# Patient Record
Sex: Female | Born: 1945 | Race: Black or African American | Hispanic: No | Marital: Married | Smoking: Never smoker
Health system: Southern US, Community
[De-identification: ages and names within clinical notes are randomized; demographics above are authoritative.]

## PROBLEM LIST (undated history)

## (undated) DIAGNOSIS — J189 Pneumonia, unspecified organism: Secondary | ICD-10-CM

---

## 2000-01-21 ENCOUNTER — Ambulatory Visit (HOSPITAL_COMMUNITY): Admission: RE | Admit: 2000-01-21 | Discharge: 2000-01-22 | Payer: Self-pay | Admitting: Ophthalmology

## 2000-01-21 ENCOUNTER — Encounter: Payer: Self-pay | Admitting: Ophthalmology

## 2014-02-20 DIAGNOSIS — I10 Essential (primary) hypertension: Secondary | ICD-10-CM | POA: Insufficient documentation

## 2014-11-23 DIAGNOSIS — Z1211 Encounter for screening for malignant neoplasm of colon: Secondary | ICD-10-CM | POA: Diagnosis not present

## 2014-11-23 DIAGNOSIS — I10 Essential (primary) hypertension: Secondary | ICD-10-CM | POA: Diagnosis not present

## 2014-11-23 DIAGNOSIS — Z1231 Encounter for screening mammogram for malignant neoplasm of breast: Secondary | ICD-10-CM | POA: Diagnosis not present

## 2014-11-23 DIAGNOSIS — R55 Syncope and collapse: Secondary | ICD-10-CM | POA: Diagnosis not present

## 2014-11-23 DIAGNOSIS — Z1239 Encounter for other screening for malignant neoplasm of breast: Secondary | ICD-10-CM | POA: Diagnosis not present

## 2014-12-12 DIAGNOSIS — Z1211 Encounter for screening for malignant neoplasm of colon: Secondary | ICD-10-CM | POA: Diagnosis not present

## 2014-12-13 DIAGNOSIS — R928 Other abnormal and inconclusive findings on diagnostic imaging of breast: Secondary | ICD-10-CM | POA: Diagnosis not present

## 2014-12-27 DIAGNOSIS — J40 Bronchitis, not specified as acute or chronic: Secondary | ICD-10-CM | POA: Diagnosis not present

## 2014-12-27 DIAGNOSIS — R05 Cough: Secondary | ICD-10-CM | POA: Diagnosis not present

## 2015-01-24 DIAGNOSIS — Z1211 Encounter for screening for malignant neoplasm of colon: Secondary | ICD-10-CM | POA: Diagnosis not present

## 2015-01-24 DIAGNOSIS — K5730 Diverticulosis of large intestine without perforation or abscess without bleeding: Secondary | ICD-10-CM | POA: Diagnosis not present

## 2015-01-24 DIAGNOSIS — D123 Benign neoplasm of transverse colon: Secondary | ICD-10-CM | POA: Diagnosis not present

## 2015-01-24 DIAGNOSIS — K635 Polyp of colon: Secondary | ICD-10-CM | POA: Diagnosis not present

## 2015-01-24 DIAGNOSIS — D120 Benign neoplasm of cecum: Secondary | ICD-10-CM | POA: Diagnosis not present

## 2015-01-24 DIAGNOSIS — K648 Other hemorrhoids: Secondary | ICD-10-CM | POA: Diagnosis not present

## 2015-01-30 DIAGNOSIS — I83813 Varicose veins of bilateral lower extremities with pain: Secondary | ICD-10-CM | POA: Diagnosis not present

## 2015-02-05 DIAGNOSIS — R55 Syncope and collapse: Secondary | ICD-10-CM | POA: Diagnosis not present

## 2015-02-08 DIAGNOSIS — R7989 Other specified abnormal findings of blood chemistry: Secondary | ICD-10-CM | POA: Diagnosis not present

## 2015-02-08 DIAGNOSIS — Z0000 Encounter for general adult medical examination without abnormal findings: Secondary | ICD-10-CM | POA: Diagnosis not present

## 2015-02-08 DIAGNOSIS — I10 Essential (primary) hypertension: Secondary | ICD-10-CM | POA: Diagnosis not present

## 2015-02-08 DIAGNOSIS — R6889 Other general symptoms and signs: Secondary | ICD-10-CM | POA: Diagnosis not present

## 2015-02-08 DIAGNOSIS — Z23 Encounter for immunization: Secondary | ICD-10-CM | POA: Diagnosis not present

## 2015-06-12 DIAGNOSIS — Z23 Encounter for immunization: Secondary | ICD-10-CM | POA: Diagnosis not present

## 2015-06-20 DIAGNOSIS — R928 Other abnormal and inconclusive findings on diagnostic imaging of breast: Secondary | ICD-10-CM | POA: Diagnosis not present

## 2015-06-20 DIAGNOSIS — Z09 Encounter for follow-up examination after completed treatment for conditions other than malignant neoplasm: Secondary | ICD-10-CM | POA: Diagnosis not present

## 2015-07-17 DIAGNOSIS — L729 Follicular cyst of the skin and subcutaneous tissue, unspecified: Secondary | ICD-10-CM | POA: Diagnosis not present

## 2015-08-14 DIAGNOSIS — I10 Essential (primary) hypertension: Secondary | ICD-10-CM | POA: Diagnosis not present

## 2015-12-19 DIAGNOSIS — R928 Other abnormal and inconclusive findings on diagnostic imaging of breast: Secondary | ICD-10-CM | POA: Diagnosis not present

## 2015-12-19 DIAGNOSIS — Z09 Encounter for follow-up examination after completed treatment for conditions other than malignant neoplasm: Secondary | ICD-10-CM | POA: Diagnosis not present

## 2015-12-19 DIAGNOSIS — N6489 Other specified disorders of breast: Secondary | ICD-10-CM | POA: Diagnosis not present

## 2016-02-13 DIAGNOSIS — Z1159 Encounter for screening for other viral diseases: Secondary | ICD-10-CM | POA: Diagnosis not present

## 2016-02-13 DIAGNOSIS — I10 Essential (primary) hypertension: Secondary | ICD-10-CM | POA: Diagnosis not present

## 2016-03-17 DIAGNOSIS — M25569 Pain in unspecified knee: Secondary | ICD-10-CM | POA: Diagnosis not present

## 2016-03-24 DIAGNOSIS — M25569 Pain in unspecified knee: Secondary | ICD-10-CM | POA: Diagnosis not present

## 2016-03-24 DIAGNOSIS — M2242 Chondromalacia patellae, left knee: Secondary | ICD-10-CM | POA: Diagnosis not present

## 2016-05-21 DIAGNOSIS — Z23 Encounter for immunization: Secondary | ICD-10-CM | POA: Diagnosis not present

## 2016-08-21 DIAGNOSIS — J069 Acute upper respiratory infection, unspecified: Secondary | ICD-10-CM | POA: Diagnosis not present

## 2016-09-02 DIAGNOSIS — I10 Essential (primary) hypertension: Secondary | ICD-10-CM | POA: Diagnosis not present

## 2016-09-02 DIAGNOSIS — Z13820 Encounter for screening for osteoporosis: Secondary | ICD-10-CM | POA: Diagnosis not present

## 2016-09-02 DIAGNOSIS — Z0000 Encounter for general adult medical examination without abnormal findings: Secondary | ICD-10-CM | POA: Diagnosis not present

## 2016-09-02 DIAGNOSIS — Z86010 Personal history of colonic polyps: Secondary | ICD-10-CM | POA: Diagnosis not present

## 2016-09-18 DIAGNOSIS — M8588 Other specified disorders of bone density and structure, other site: Secondary | ICD-10-CM | POA: Diagnosis not present

## 2016-09-18 DIAGNOSIS — Z780 Asymptomatic menopausal state: Secondary | ICD-10-CM | POA: Diagnosis not present

## 2016-09-18 DIAGNOSIS — Z13820 Encounter for screening for osteoporosis: Secondary | ICD-10-CM | POA: Diagnosis not present

## 2016-09-22 DIAGNOSIS — M8580 Other specified disorders of bone density and structure, unspecified site: Secondary | ICD-10-CM | POA: Insufficient documentation

## 2017-03-02 DIAGNOSIS — I10 Essential (primary) hypertension: Secondary | ICD-10-CM | POA: Diagnosis not present

## 2017-03-31 DIAGNOSIS — M1712 Unilateral primary osteoarthritis, left knee: Secondary | ICD-10-CM | POA: Diagnosis not present

## 2017-06-03 DIAGNOSIS — Z23 Encounter for immunization: Secondary | ICD-10-CM | POA: Diagnosis not present

## 2017-09-08 DIAGNOSIS — Z86010 Personal history of colonic polyps: Secondary | ICD-10-CM | POA: Insufficient documentation

## 2017-09-09 DIAGNOSIS — I10 Essential (primary) hypertension: Secondary | ICD-10-CM | POA: Diagnosis not present

## 2017-09-09 DIAGNOSIS — Z0000 Encounter for general adult medical examination without abnormal findings: Secondary | ICD-10-CM | POA: Diagnosis not present

## 2017-09-09 DIAGNOSIS — Z79899 Other long term (current) drug therapy: Secondary | ICD-10-CM | POA: Diagnosis not present

## 2017-09-25 DIAGNOSIS — E876 Hypokalemia: Secondary | ICD-10-CM | POA: Diagnosis not present

## 2017-12-16 DIAGNOSIS — R922 Inconclusive mammogram: Secondary | ICD-10-CM | POA: Diagnosis not present

## 2017-12-16 DIAGNOSIS — Z1231 Encounter for screening mammogram for malignant neoplasm of breast: Secondary | ICD-10-CM | POA: Diagnosis not present

## 2017-12-16 DIAGNOSIS — N6489 Other specified disorders of breast: Secondary | ICD-10-CM | POA: Diagnosis not present

## 2018-03-09 DIAGNOSIS — E876 Hypokalemia: Secondary | ICD-10-CM | POA: Diagnosis not present

## 2018-03-09 DIAGNOSIS — I10 Essential (primary) hypertension: Secondary | ICD-10-CM | POA: Diagnosis not present

## 2018-03-09 DIAGNOSIS — M8588 Other specified disorders of bone density and structure, other site: Secondary | ICD-10-CM | POA: Diagnosis not present

## 2018-03-09 DIAGNOSIS — Z0000 Encounter for general adult medical examination without abnormal findings: Secondary | ICD-10-CM | POA: Diagnosis not present

## 2018-05-07 DIAGNOSIS — S6992XA Unspecified injury of left wrist, hand and finger(s), initial encounter: Secondary | ICD-10-CM | POA: Insufficient documentation

## 2018-05-07 DIAGNOSIS — S6992XD Unspecified injury of left wrist, hand and finger(s), subsequent encounter: Secondary | ICD-10-CM | POA: Diagnosis not present

## 2018-05-07 DIAGNOSIS — Z23 Encounter for immunization: Secondary | ICD-10-CM | POA: Diagnosis not present

## 2018-05-07 DIAGNOSIS — M79645 Pain in left finger(s): Secondary | ICD-10-CM | POA: Diagnosis not present

## 2019-04-08 DIAGNOSIS — Z124 Encounter for screening for malignant neoplasm of cervix: Secondary | ICD-10-CM | POA: Diagnosis not present

## 2019-04-08 DIAGNOSIS — S6992XS Unspecified injury of left wrist, hand and finger(s), sequela: Secondary | ICD-10-CM | POA: Diagnosis not present

## 2019-04-08 DIAGNOSIS — Z13228 Encounter for screening for other metabolic disorders: Secondary | ICD-10-CM | POA: Diagnosis not present

## 2019-04-08 DIAGNOSIS — Z79899 Other long term (current) drug therapy: Secondary | ICD-10-CM | POA: Diagnosis not present

## 2019-04-08 DIAGNOSIS — Z1389 Encounter for screening for other disorder: Secondary | ICD-10-CM | POA: Diagnosis not present

## 2019-04-08 DIAGNOSIS — I10 Essential (primary) hypertension: Secondary | ICD-10-CM | POA: Diagnosis not present

## 2019-04-08 DIAGNOSIS — Z0000 Encounter for general adult medical examination without abnormal findings: Secondary | ICD-10-CM | POA: Diagnosis not present

## 2019-04-08 DIAGNOSIS — Z13220 Encounter for screening for lipoid disorders: Secondary | ICD-10-CM | POA: Diagnosis not present

## 2019-04-08 DIAGNOSIS — Z1239 Encounter for other screening for malignant neoplasm of breast: Secondary | ICD-10-CM | POA: Diagnosis not present

## 2019-04-08 DIAGNOSIS — Z130 Encounter for screening for diseases of the blood and blood-forming organs and certain disorders involving the immune mechanism: Secondary | ICD-10-CM | POA: Diagnosis not present

## 2019-04-08 DIAGNOSIS — Z1329 Encounter for screening for other suspected endocrine disorder: Secondary | ICD-10-CM | POA: Diagnosis not present

## 2019-04-08 DIAGNOSIS — M8588 Other specified disorders of bone density and structure, other site: Secondary | ICD-10-CM | POA: Diagnosis not present

## 2019-04-08 DIAGNOSIS — Z1321 Encounter for screening for nutritional disorder: Secondary | ICD-10-CM | POA: Diagnosis not present

## 2019-04-26 DIAGNOSIS — Z1231 Encounter for screening mammogram for malignant neoplasm of breast: Secondary | ICD-10-CM | POA: Diagnosis not present

## 2019-04-26 DIAGNOSIS — Z780 Asymptomatic menopausal state: Secondary | ICD-10-CM | POA: Diagnosis not present

## 2019-04-26 DIAGNOSIS — M8588 Other specified disorders of bone density and structure, other site: Secondary | ICD-10-CM | POA: Diagnosis not present

## 2019-04-26 DIAGNOSIS — M8589 Other specified disorders of bone density and structure, multiple sites: Secondary | ICD-10-CM | POA: Diagnosis not present

## 2019-04-26 DIAGNOSIS — Z1239 Encounter for other screening for malignant neoplasm of breast: Secondary | ICD-10-CM | POA: Diagnosis not present

## 2019-05-17 DIAGNOSIS — S99921A Unspecified injury of right foot, initial encounter: Secondary | ICD-10-CM | POA: Diagnosis not present

## 2019-05-17 DIAGNOSIS — M654 Radial styloid tenosynovitis [de Quervain]: Secondary | ICD-10-CM | POA: Insufficient documentation

## 2019-05-17 DIAGNOSIS — S92514A Nondisplaced fracture of proximal phalanx of right lesser toe(s), initial encounter for closed fracture: Secondary | ICD-10-CM | POA: Diagnosis not present

## 2019-05-17 DIAGNOSIS — M7989 Other specified soft tissue disorders: Secondary | ICD-10-CM | POA: Diagnosis not present

## 2019-05-17 DIAGNOSIS — I10 Essential (primary) hypertension: Secondary | ICD-10-CM | POA: Diagnosis not present

## 2019-05-17 DIAGNOSIS — S92524A Nondisplaced fracture of medial phalanx of right lesser toe(s), initial encounter for closed fracture: Secondary | ICD-10-CM | POA: Diagnosis not present

## 2019-05-17 DIAGNOSIS — M8580 Other specified disorders of bone density and structure, unspecified site: Secondary | ICD-10-CM | POA: Diagnosis not present

## 2019-05-17 DIAGNOSIS — M7731 Calcaneal spur, right foot: Secondary | ICD-10-CM | POA: Diagnosis not present

## 2019-05-17 DIAGNOSIS — M1712 Unilateral primary osteoarthritis, left knee: Secondary | ICD-10-CM | POA: Diagnosis not present

## 2019-05-17 DIAGNOSIS — M19071 Primary osteoarthritis, right ankle and foot: Secondary | ICD-10-CM | POA: Diagnosis not present

## 2019-05-18 DIAGNOSIS — Z23 Encounter for immunization: Secondary | ICD-10-CM | POA: Diagnosis not present

## 2019-05-24 DIAGNOSIS — S92514A Nondisplaced fracture of proximal phalanx of right lesser toe(s), initial encounter for closed fracture: Secondary | ICD-10-CM | POA: Diagnosis not present

## 2019-12-30 DIAGNOSIS — J0100 Acute maxillary sinusitis, unspecified: Secondary | ICD-10-CM | POA: Diagnosis not present

## 2020-03-08 DIAGNOSIS — H539 Unspecified visual disturbance: Secondary | ICD-10-CM | POA: Diagnosis not present

## 2020-03-08 DIAGNOSIS — H2511 Age-related nuclear cataract, right eye: Secondary | ICD-10-CM | POA: Diagnosis not present

## 2020-03-08 DIAGNOSIS — Z961 Presence of intraocular lens: Secondary | ICD-10-CM | POA: Diagnosis not present

## 2020-03-08 DIAGNOSIS — H338 Other retinal detachments: Secondary | ICD-10-CM | POA: Diagnosis not present

## 2020-03-08 DIAGNOSIS — Z135 Encounter for screening for eye and ear disorders: Secondary | ICD-10-CM | POA: Diagnosis not present

## 2020-03-28 DIAGNOSIS — K5792 Diverticulitis of intestine, part unspecified, without perforation or abscess without bleeding: Secondary | ICD-10-CM | POA: Diagnosis not present

## 2020-03-28 DIAGNOSIS — I10 Essential (primary) hypertension: Secondary | ICD-10-CM | POA: Diagnosis not present

## 2020-03-28 DIAGNOSIS — K5732 Diverticulitis of large intestine without perforation or abscess without bleeding: Secondary | ICD-10-CM | POA: Diagnosis not present

## 2020-03-28 DIAGNOSIS — D175 Benign lipomatous neoplasm of intra-abdominal organs: Secondary | ICD-10-CM | POA: Diagnosis not present

## 2020-03-28 DIAGNOSIS — D171 Benign lipomatous neoplasm of skin and subcutaneous tissue of trunk: Secondary | ICD-10-CM | POA: Diagnosis not present

## 2020-03-28 DIAGNOSIS — D1779 Benign lipomatous neoplasm of other sites: Secondary | ICD-10-CM | POA: Diagnosis not present

## 2020-03-28 DIAGNOSIS — R1030 Lower abdominal pain, unspecified: Secondary | ICD-10-CM | POA: Diagnosis not present

## 2020-05-18 DIAGNOSIS — Z23 Encounter for immunization: Secondary | ICD-10-CM | POA: Diagnosis not present

## 2020-06-01 DIAGNOSIS — J069 Acute upper respiratory infection, unspecified: Secondary | ICD-10-CM | POA: Diagnosis not present

## 2020-06-08 DIAGNOSIS — J209 Acute bronchitis, unspecified: Secondary | ICD-10-CM | POA: Diagnosis not present

## 2020-06-28 DIAGNOSIS — I10 Essential (primary) hypertension: Secondary | ICD-10-CM | POA: Diagnosis not present

## 2020-06-28 DIAGNOSIS — Z0000 Encounter for general adult medical examination without abnormal findings: Secondary | ICD-10-CM | POA: Diagnosis not present

## 2020-08-25 ENCOUNTER — Other Ambulatory Visit: Payer: Self-pay

## 2020-08-25 ENCOUNTER — Encounter: Payer: Self-pay | Admitting: Emergency Medicine

## 2020-08-25 ENCOUNTER — Emergency Department: Admit: 2020-08-25 | Discharge status: Absent without leave | Payer: Self-pay

## 2020-08-25 ENCOUNTER — Emergency Department
Admission: EM | Admit: 2020-08-25 | Discharge: 2020-08-25 | Disposition: A | Discharge status: Home / Self Care | Payer: Self-pay | Source: Home / Self Care

## 2020-08-25 ENCOUNTER — Emergency Department (INDEPENDENT_AMBULATORY_CARE_PROVIDER_SITE_OTHER): Payer: Medicare Other

## 2020-08-25 DIAGNOSIS — J181 Lobar pneumonia, unspecified organism: Secondary | ICD-10-CM

## 2020-08-25 DIAGNOSIS — Z862 Personal history of diseases of the blood and blood-forming organs and certain disorders involving the immune mechanism: Secondary | ICD-10-CM

## 2020-08-25 DIAGNOSIS — J189 Pneumonia, unspecified organism: Secondary | ICD-10-CM

## 2020-08-25 DIAGNOSIS — R61 Generalized hyperhidrosis: Secondary | ICD-10-CM | POA: Diagnosis not present

## 2020-08-25 DIAGNOSIS — R059 Cough, unspecified: Secondary | ICD-10-CM | POA: Diagnosis not present

## 2020-08-25 DIAGNOSIS — R0781 Pleurodynia: Secondary | ICD-10-CM | POA: Diagnosis not present

## 2020-08-25 MED ORDER — LEVOFLOXACIN 500 MG PO TABS: 500.0000 mg | ORAL_TABLET | Freq: Every day | ORAL | 0 refills | Status: DC

## 2020-08-25 NOTE — ED Triage Notes (Addendum)
Cough x 1 week  Unable to sleep at night - dry cough Moderna booster 07/19/20 Wakes up w/ sweats at night Left rib pain with coughing DOE

## 2020-08-25 NOTE — Discharge Instructions (Signed)
°  Please take antibiotics as prescribed and be sure to complete entire course even if you start to feel better to ensure infection does not come back.  Call Monday to schedule a follow up appointment with primary care provider for recheck of symptoms. They may want to repeat a chest x-ray later next week or the following week to make sure your lungs are improving.   Call 911 or have someone drive you to the hospital if symptoms significantly worsening- chest pain, trouble breathing, dizziness/passing out or other new concerning symptoms develop.

## 2020-08-25 NOTE — ED Provider Notes (Signed)
[name]    CSN: [id number] Arrival date & time: 08/25/20  1213      History   Chief Complaint Chief Complaint  Patient presents with  . Cough    HPI Nicole Jennings is a 75 y.o. female.   HPI  Nicole Jennings is a 75 y.o. female presenting to UC with c/o 1 week of cough, congestion, night sweats and left side rib pain that started yesterday after coughing hard.  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 Pt has had COVID vaccines including booster on 07/19/20.  No known sick contacts. No fever at home but temp of 99.9*F in triage.  Pt concerned she may have cracked a rib while coughing earlier.   History reviewed. No pertinent past medical history.  Patient Active Problem List   Diagnosis Date Noted  . History of colon polyps 09/08/2017  . Osteoarthritis of left knee 03/31/2017  . Osteopenia 09/22/2016  . Essential hypertension 02/20/2014    History reviewed. No pertinent surgical history.  OB History   No obstetric history on file.      Home Medications    Prior to Admission medications   Medication Sig Start Date End Date Taking? Authorizing Provider  amLODipine (NORVASC) 10 MG tablet Take 1 tablet by mouth daily. 04/07/17  Yes [provider]  hydrochlorothiazide (HYDRODIURIL) 25 MG tablet Take by mouth. 05/18/20  Yes [provider]  levofloxacin (LEVAQUIN) 500 MG tablet Take 1 tablet (500 mg total) by mouth daily. 08/25/20  Yes Dewayne Severe O, PA-C  losartan (COZAAR) 100 MG tablet Take 1 tablet by mouth daily. 04/16/20  Yes [provider]    Family History Family History  Problem Relation Age of Onset  . Hyperlipidemia Mother   . Healthy Father   . Healthy Sister   . Healthy Sister     Social History Social History   Tobacco Use  . Smoking status: Never Smoker  . Smokeless tobacco: Never Used  Vaping Use  . Vaping Use: Never used  Substance Use  Topics  . Alcohol use: Not Currently  . Drug use: Never     Allergies   Penicillins and Latex   Review of Systems Review of Systems  Constitutional: Positive for diaphoresis. Negative for chills and fever.  HENT: Positive for congestion. Negative for ear pain, sore throat, trouble swallowing and voice change.   Respiratory: Positive for cough and shortness of breath (intermittent).   Cardiovascular: Negative for chest pain and palpitations.  Gastrointestinal: Negative for abdominal pain, diarrhea, nausea and vomiting.  Musculoskeletal: Negative for arthralgias, back pain and myalgias.  Skin: Negative for rash.  All other systems reviewed and are negative.    Physical Exam Triage Vital Signs ED Triage Vitals  Enc Vitals Group     BP 08/25/20 1307 (!) 152/84     Pulse Rate 08/25/20 1307 (!) 101     Resp 08/25/20 1307 20     Temp 08/25/20 1307 99.9 F (37.7 C)     Temp Source 08/25/20 1307 Oral     SpO2 08/25/20 1307 95 %     Weight 08/25/20 1311 175 lb (79.4 kg)     Height 08/25/20 1311 5\' 2"  (1.575 m)     Head Circumference --      Peak Flow --      Pain Score 08/25/20 1318 3     Pain Loc --  Pain Edu? --      Excl. in [place]? --    No data found.  Updated Vital Signs BP (!) 152/84 (BP Location: Right Arm)   Pulse (!) 101   Temp 99.9 F (37.7 C) (Oral)   Resp 20   Ht 5\' 2"  (1.575 m)   Wt 175 lb (79.4 kg)   SpO2 95%   BMI 32.01 kg/m   Visual Acuity Right Eye Distance:   Left Eye Distance:   Bilateral Distance:    Right Eye Near:   Left Eye Near:    Bilateral Near:     Physical Exam Vitals and nursing note reviewed.  Constitutional:      General: She is not in acute distress.    Appearance: Normal appearance. She is well-developed and well-nourished. She is not ill-appearing, toxic-appearing or diaphoretic.  HENT:     Head: Normocephalic and atraumatic.     Right Ear: Tympanic membrane and ear canal normal.     Left Ear: Tympanic membrane and ear  canal normal.     Nose: Nose normal.     Right Sinus: No maxillary sinus tenderness or frontal sinus tenderness.     Left Sinus: No maxillary sinus tenderness or frontal sinus tenderness.     Mouth/Throat:     Lips: Pink.     Mouth: Mucous membranes are moist.     Pharynx: Oropharynx is clear. Uvula midline. No pharyngeal swelling, oropharyngeal exudate, posterior oropharyngeal erythema or uvula swelling.  Eyes:     Extraocular Movements: EOM normal.  Cardiovascular:     Rate and Rhythm: Normal rate and regular rhythm.  Pulmonary:     Effort: Pulmonary effort is normal. No respiratory distress.     Breath sounds: No stridor. Rhonchi (diffuse) present. No wheezing or rales.     Comments: Able to speak in full sentences, no respiratory distress. Chest:     Chest wall: Tenderness present.    Musculoskeletal:        General: Normal range of motion.     Cervical back: Normal range of motion and neck supple. No tenderness.  Lymphadenopathy:     Cervical: No cervical adenopathy.  Skin:    General: Skin is warm and dry.  Neurological:     Mental Status: She is alert and oriented to person, place, and time.  Psychiatric:        Mood and Affect: Mood and affect normal.        Behavior: Behavior normal.      UC Treatments / Results  Labs (all labs ordered are listed, but only abnormal results are displayed) Labs Reviewed  COVID-19, FLU A+B AND RSV    EKG   Radiology DG Chest 2 View  Result Date: 08/25/2020 CLINICAL DATA:  Cough, left chest and rib pain, night sweats EXAM: CHEST - 2 VIEW COMPARISON:  None. FINDINGS: Background perihilar and upper lobe parenchymal bandlike scarring and retraction compatible with chronic lung disease. Asymmetric left upper lobe hilar patchy and nodular opacities may represent superimposed pneumonia. Blunting of left costophrenic angle suggests small left effusion. Additional areas of bibasilar scarring or atelectasis. No pneumothorax. Trachea  midline. Normal heart size. No acute osseous finding. IMPRESSION: Left upper lobe/hilar patchy and nodular airspace process concerning for pneumonia and possibly associated small left effusion. Recommend radiographic follow-up after treatment to document resolution and to exclude underlying mass. 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  Electronically Signed   By: Jerilynn Mages.  Shick M.D.   On: 08/25/2020 14:06    Procedures Procedures (including critical care time)  Medications Ordered in UC Medications - No data to display  Initial Impression / Assessment and Plan / UC Course  I have reviewed the triage vital signs and the nursing notes.  Pertinent labs & imaging results that were available during my care of the patient were reviewed by me and considered in my medical decision making (see chart for details).    Hx and CXR c/w pneumonia Discussed sarcoidosis with pt, pt states she was dx with it in her 53s but states she has never had an issue with it COVID/flu/RSV tests pending Rx: levaquin Encouraged f/u with PCP next week Discussed symptoms that warrant emergent care in the ED. AVS given  Final Clinical Impressions(s) / UC Diagnoses   Final diagnoses:  Community acquired pneumonia of left upper lobe of lung  History of sarcoidosis     Discharge Instructions      Please take antibiotics as prescribed and be sure to complete entire course even if you start to feel better to ensure infection does not come back.  Call Monday to schedule a follow up appointment with primary care provider for recheck of symptoms. They may want to repeat a chest x-ray later next week or the following week to make sure your lungs are improving.   Call 911 or have someone drive you to the hospital if symptoms significantly worsening- chest pain, trouble breathing, dizziness/passing out  or other new concerning symptoms develop.      ED Prescriptions    Medication Sig Dispense Auth. Provider   levofloxacin (LEVAQUIN) 500 MG tablet Take 1 tablet (500 mg total) by mouth daily. 7 tablet Noe Gens, PA-C     PDMP not reviewed this encounter.   Noe Gens, PA-C 08/25/20 1501

## 2020-08-29 LAB — COVID-19, FLU A+B AND RSV
Influenza A, NAA: NOT DETECTED
Influenza B, NAA: NOT DETECTED
RSV, NAA: NOT DETECTED
SARS-CoV-2, NAA: NOT DETECTED

## 2020-08-31 DIAGNOSIS — I10 Essential (primary) hypertension: Secondary | ICD-10-CM | POA: Diagnosis not present

## 2020-08-31 DIAGNOSIS — Z09 Encounter for follow-up examination after completed treatment for conditions other than malignant neoplasm: Secondary | ICD-10-CM | POA: Diagnosis not present

## 2020-08-31 DIAGNOSIS — J189 Pneumonia, unspecified organism: Secondary | ICD-10-CM | POA: Insufficient documentation

## 2020-08-31 DIAGNOSIS — D869 Sarcoidosis, unspecified: Secondary | ICD-10-CM | POA: Diagnosis not present

## 2020-09-06 DIAGNOSIS — I700 Atherosclerosis of aorta: Secondary | ICD-10-CM | POA: Diagnosis not present

## 2020-09-06 DIAGNOSIS — J8410 Pulmonary fibrosis, unspecified: Secondary | ICD-10-CM | POA: Diagnosis not present

## 2020-09-06 DIAGNOSIS — R918 Other nonspecific abnormal finding of lung field: Secondary | ICD-10-CM | POA: Diagnosis not present

## 2020-09-06 DIAGNOSIS — D860 Sarcoidosis of lung: Secondary | ICD-10-CM | POA: Diagnosis not present

## 2020-09-06 DIAGNOSIS — D869 Sarcoidosis, unspecified: Secondary | ICD-10-CM | POA: Diagnosis not present

## 2020-09-06 DIAGNOSIS — J479 Bronchiectasis, uncomplicated: Secondary | ICD-10-CM | POA: Diagnosis not present

## 2020-09-06 DIAGNOSIS — E042 Nontoxic multinodular goiter: Secondary | ICD-10-CM | POA: Diagnosis not present

## 2020-09-07 DIAGNOSIS — H338 Other retinal detachments: Secondary | ICD-10-CM | POA: Diagnosis not present

## 2020-09-07 DIAGNOSIS — H2511 Age-related nuclear cataract, right eye: Secondary | ICD-10-CM | POA: Diagnosis not present

## 2020-09-07 DIAGNOSIS — H43811 Vitreous degeneration, right eye: Secondary | ICD-10-CM | POA: Diagnosis not present

## 2020-09-07 DIAGNOSIS — Z961 Presence of intraocular lens: Secondary | ICD-10-CM | POA: Diagnosis not present

## 2020-09-21 DIAGNOSIS — E042 Nontoxic multinodular goiter: Secondary | ICD-10-CM | POA: Diagnosis not present

## 2020-09-21 DIAGNOSIS — E041 Nontoxic single thyroid nodule: Secondary | ICD-10-CM | POA: Diagnosis not present

## 2020-09-27 DIAGNOSIS — H6121 Impacted cerumen, right ear: Secondary | ICD-10-CM | POA: Diagnosis not present

## 2020-10-22 DIAGNOSIS — I10 Essential (primary) hypertension: Secondary | ICD-10-CM | POA: Diagnosis not present

## 2020-10-22 DIAGNOSIS — I8393 Asymptomatic varicose veins of bilateral lower extremities: Secondary | ICD-10-CM | POA: Diagnosis not present

## 2020-11-08 DIAGNOSIS — I8312 Varicose veins of left lower extremity with inflammation: Secondary | ICD-10-CM | POA: Diagnosis not present

## 2020-11-08 DIAGNOSIS — I83813 Varicose veins of bilateral lower extremities with pain: Secondary | ICD-10-CM | POA: Diagnosis not present

## 2020-11-08 DIAGNOSIS — I83893 Varicose veins of bilateral lower extremities with other complications: Secondary | ICD-10-CM | POA: Diagnosis not present

## 2020-11-08 DIAGNOSIS — I8311 Varicose veins of right lower extremity with inflammation: Secondary | ICD-10-CM | POA: Diagnosis not present

## 2020-11-12 DIAGNOSIS — I10 Essential (primary) hypertension: Secondary | ICD-10-CM | POA: Diagnosis not present

## 2020-11-12 DIAGNOSIS — Z23 Encounter for immunization: Secondary | ICD-10-CM | POA: Diagnosis not present

## 2020-11-12 DIAGNOSIS — I83813 Varicose veins of bilateral lower extremities with pain: Secondary | ICD-10-CM | POA: Diagnosis not present

## 2020-11-14 DIAGNOSIS — H52201 Unspecified astigmatism, right eye: Secondary | ICD-10-CM | POA: Diagnosis not present

## 2020-11-14 DIAGNOSIS — H35372 Puckering of macula, left eye: Secondary | ICD-10-CM | POA: Diagnosis not present

## 2020-11-14 DIAGNOSIS — H25811 Combined forms of age-related cataract, right eye: Secondary | ICD-10-CM | POA: Diagnosis not present

## 2020-11-14 DIAGNOSIS — H43811 Vitreous degeneration, right eye: Secondary | ICD-10-CM | POA: Diagnosis not present

## 2020-11-14 DIAGNOSIS — H33002 Unspecified retinal detachment with retinal break, left eye: Secondary | ICD-10-CM | POA: Diagnosis not present

## 2020-11-14 DIAGNOSIS — Z961 Presence of intraocular lens: Secondary | ICD-10-CM | POA: Diagnosis not present

## 2020-11-14 DIAGNOSIS — H527 Unspecified disorder of refraction: Secondary | ICD-10-CM | POA: Diagnosis not present

## 2020-11-21 DIAGNOSIS — I8312 Varicose veins of left lower extremity with inflammation: Secondary | ICD-10-CM | POA: Diagnosis not present

## 2020-11-21 DIAGNOSIS — I8311 Varicose veins of right lower extremity with inflammation: Secondary | ICD-10-CM | POA: Diagnosis not present

## 2020-11-27 DIAGNOSIS — I8312 Varicose veins of left lower extremity with inflammation: Secondary | ICD-10-CM | POA: Diagnosis not present

## 2020-11-27 DIAGNOSIS — I83813 Varicose veins of bilateral lower extremities with pain: Secondary | ICD-10-CM | POA: Diagnosis not present

## 2020-11-27 DIAGNOSIS — I8311 Varicose veins of right lower extremity with inflammation: Secondary | ICD-10-CM | POA: Diagnosis not present

## 2020-12-06 ENCOUNTER — Encounter: Payer: Self-pay | Admitting: Emergency Medicine

## 2020-12-06 ENCOUNTER — Emergency Department
Admission: EM | Admit: 2020-12-06 | Discharge: 2020-12-06 | Disposition: A | Discharge status: Home / Self Care | Payer: Medicare Other | Source: Home / Self Care

## 2020-12-06 ENCOUNTER — Emergency Department (INDEPENDENT_AMBULATORY_CARE_PROVIDER_SITE_OTHER): Payer: Medicare Other

## 2020-12-06 ENCOUNTER — Other Ambulatory Visit: Payer: Self-pay

## 2020-12-06 DIAGNOSIS — R0602 Shortness of breath: Secondary | ICD-10-CM

## 2020-12-06 DIAGNOSIS — R059 Cough, unspecified: Secondary | ICD-10-CM | POA: Diagnosis not present

## 2020-12-06 DIAGNOSIS — J40 Bronchitis, not specified as acute or chronic: Secondary | ICD-10-CM

## 2020-12-06 DIAGNOSIS — J069 Acute upper respiratory infection, unspecified: Secondary | ICD-10-CM

## 2020-12-06 DIAGNOSIS — D869 Sarcoidosis, unspecified: Secondary | ICD-10-CM | POA: Diagnosis present

## 2020-12-06 HISTORY — DX: Pneumonia, unspecified organism: J18.9

## 2020-12-06 MED ORDER — ACETAMINOPHEN 325 MG PO TABS
650.0000 mg | ORAL_TABLET | Freq: Once | ORAL | Status: AC
  Administered 2020-12-06: 650 mg via ORAL

## 2020-12-06 MED ORDER — PREDNISONE 10 MG (21) PO TBPK: ORAL_TABLET | Freq: Every day | ORAL | 0 refills | Status: AC

## 2020-12-06 MED ORDER — AZITHROMYCIN 250 MG PO TABS: 250.0000 mg | ORAL_TABLET | Freq: Every day | ORAL | 0 refills | Status: DC

## 2020-12-06 NOTE — ED Provider Notes (Signed)
[facility]   [id number] 12/06/20 Arrival Time: 6834   CC: COVID symptoms  SUBJECTIVE: History from: patient.  Nicole Jennings is a 75 y.o. female who presents with cough, congestion, wheezing, SOB x 5 days. Denies sick exposure to COVID, flu or strep. Denies recent travel. Has negative history of Covid. Has completed Covid vaccines. Has completed flu vaccine this year. Has taken tylenol with this with little relief. Cough, wheezing are worse with activity. Medical history includes CAP, sarcoidosis, HTN, osteopenia, hx colon polyps. Reports previous symptoms in the past with pneumonia in 08/2020. Denies fever,  sinus pain, rhinorrhea, sore throat, nausea, changes in bowel or bladder habits.    ROS: As per HPI.  All other pertinent ROS negative.     Past Medical History:  Diagnosis Date  . Pneumonia    History reviewed. No pertinent surgical history. Allergies  Allergen Reactions  . Penicillins Hives    Other reaction(s): Other (See Comments) SKIN SCALEY, SWELLING SKIN SCALEY, SWELLING   . Latex Itching    LATEX GLOVES LATEX GLOVES    No current facility-administered medications on file prior to encounter.   Current Outpatient Medications on File Prior to Encounter  Medication Sig Dispense Refill  . amLODipine (NORVASC) 10 MG tablet Take 1 tablet by mouth daily.    . hydrochlorothiazide (HYDRODIURIL) 25 MG tablet Take by mouth.    . valsartan (DIOVAN) 320 MG tablet Take by mouth.    . levofloxacin (LEVAQUIN) 500 MG tablet Take 1 tablet (500 mg total) by mouth daily. (Patient not taking: Reported on 12/06/2020) 7 tablet 0  . losartan (COZAAR) 100 MG tablet Take 1 tablet by mouth daily. (Patient not taking: Reported on 12/06/2020)     Social History   Socioeconomic History  . Marital status: Married    Spouse name: Not on file  . Number of children: Not on file  . Years of education: Not on file  . Highest education level: Not on file  Occupational History   . Not on file  Tobacco Use  . Smoking status: Never Smoker  . Smokeless tobacco: Never Used  Vaping Use  . Vaping Use: Never used  Substance and Sexual Activity  . Alcohol use: Not Currently  . Drug use: Never  . Sexual activity: Not on file  Other Topics Concern  . Not on file  Social History Narrative  . Not on file   Social Determinants of Health   Financial Resource Strain: Not on file  Food Insecurity: Not on file  Transportation Needs: Not on file  Physical Activity: Not on file  Stress: Not on file  Social Connections: Not on file  Intimate Partner Violence: Not on file   Family History  Problem Relation Age of Onset  . Hyperlipidemia Mother   . Healthy Father   . Healthy Sister   . Healthy Sister     OBJECTIVE:  Vitals:   12/06/20 0910 12/06/20 1030  BP: (!) 148/80   Pulse: (!) 101 95  Resp: 17   Temp: (!) 101.3 F (38.5 C) 100.3 F (37.9 C)  TempSrc: Oral Oral  SpO2: 96%      General appearance: alert; appears fatigued, but nontoxic; speaking in full sentences and tolerating own secretions, febrile in office today HEENT: NCAT; Ears: EACs clear, TMs pearly gray; Eyes: PERRL.  EOM grossly intact. Sinuses: nontender; Nose: nares patent with clear rhinorrhea, Throat: oropharynx erythematous, cobblestoning present, tonsils non erythematous or enlarged, uvula midline  Neck: supple with  LAD Lungs: unlabored respirations, symmetrical air entry; cough: moderate; no respiratory distress; wheezing and rhonchi throughout bilateral lung fields Heart: regular rate and rhythm.  Radial pulses 2+ symmetrical bilaterally Skin: warm and dry Psychological: alert and cooperative; normal mood and affect  LABS:  No results found for this or any previous visit (from the past 24 hour(s)).   ASSESSMENT & PLAN:  1. URI with cough and congestion   2. Bronchitis   3. Sarcoidosis     Meds ordered this encounter  Medications  . acetaminophen (TYLENOL) tablet 650 mg  .  azithromycin (ZITHROMAX) 250 MG tablet    Sig: Take 1 tablet (250 mg total) by mouth daily. Take first 2 tablets together, then 1 every day until finished.    Dispense:  6 tablet    Refill:  0    Order Specific Question:   Supervising Provider    Answer:   Chase Picket [id number]  . predniSONE (STERAPRED UNI-PAK 21 TAB) 10 MG (21) TBPK tablet    Sig: Take by mouth daily for 6 days. Take 6 tablets on day 1, 5 tablets on day 2, 4 tablets on day 3, 3 tablets on day 4, 2 tablets on day 5, 1 tablet on day 6    Dispense:  21 tablet    Refill:  0    Order Specific Question:   Supervising Provider    Answer:   Chase Picket [[id number]]    Tylenol given in office today for fever Chest xray negative for pneumonia Will cover for bacterial URI secondary to viral illness and sarcoidosis Prescribed azithromycin Steroid taper prescribed Continue supportive care at home COVID and flu testing ordered.  It will take between 2-3 days for test results. Someone will contact you regarding abnormal results. Patient should remain in quarantine until they have received Covid results.  If negative you may resume normal activities (go back to work/school) while practicing hand hygiene, social distance, and mask wearing.  If positive, patient should remain in quarantine for at least 5 days from symptom onset AND greater than 72 hours after symptoms resolution (absence of fever without the use of fever-reducing medication and improvement in respiratory symptoms), whichever is longer Get plenty of rest and push fluids Use OTC zyrtec for nasal congestion, runny nose, and/or sore throat Use OTC flonase for nasal congestion and runny nose Use medications daily for symptom relief Use OTC medications like ibuprofen or tylenol as needed fever or pain Call or go to the ED if you have any new or worsening symptoms such as fever, worsening cough, shortness of breath, chest tightness, chest pain, turning blue, changes in  mental status.  Reviewed expectations re: course of current medical issues. Questions answered. Outlined signs and symptoms indicating need for more acute intervention. Patient verbalized understanding. After Visit Summary given.         Faustino Congress, NP 12/06/20 1048

## 2020-12-06 NOTE — ED Triage Notes (Signed)
Cough & congestion since sat  Denies body aches or fever Temp 101.3 in triage  Delsym BID  COVID vaccinated

## 2020-12-06 NOTE — Discharge Instructions (Addendum)
Chest xray is negative for pneumonia today  I have sent in azithromycin for you to take. Take 2 tablets today, then one tablet daily for the next 4 days.  I have sent in a prednisone taper for you to take for 6 days. 6 tablets on day one, 5 tablets on day two, 4 tablets on day three, 3 tablets on day four, 2 tablets on day five, and 1 tablet on day six.  Your COVID and Influenza tests are pending.  You should self quarantine until the test results are back.    Take Tylenol or ibuprofen as needed for fever or discomfort.  Rest and keep yourself hydrated.

## 2020-12-08 LAB — COVID-19, FLU A+B NAA
Influenza A, NAA: NOT DETECTED
Influenza B, NAA: NOT DETECTED
SARS-CoV-2, NAA: NOT DETECTED

## 2020-12-10 DIAGNOSIS — H25811 Combined forms of age-related cataract, right eye: Secondary | ICD-10-CM | POA: Diagnosis not present

## 2020-12-10 DIAGNOSIS — H52201 Unspecified astigmatism, right eye: Secondary | ICD-10-CM | POA: Diagnosis not present

## 2020-12-25 DIAGNOSIS — I83811 Varicose veins of right lower extremities with pain: Secondary | ICD-10-CM | POA: Diagnosis not present

## 2020-12-25 DIAGNOSIS — I83891 Varicose veins of right lower extremities with other complications: Secondary | ICD-10-CM | POA: Diagnosis not present

## 2020-12-27 DIAGNOSIS — Z1231 Encounter for screening mammogram for malignant neoplasm of breast: Secondary | ICD-10-CM | POA: Diagnosis not present

## 2021-01-02 DIAGNOSIS — R922 Inconclusive mammogram: Secondary | ICD-10-CM | POA: Diagnosis not present

## 2021-01-02 DIAGNOSIS — N6489 Other specified disorders of breast: Secondary | ICD-10-CM | POA: Diagnosis not present

## 2021-01-02 DIAGNOSIS — R928 Other abnormal and inconclusive findings on diagnostic imaging of breast: Secondary | ICD-10-CM | POA: Diagnosis not present

## 2021-01-07 DIAGNOSIS — I83812 Varicose veins of left lower extremities with pain: Secondary | ICD-10-CM | POA: Diagnosis not present

## 2021-01-07 DIAGNOSIS — I8312 Varicose veins of left lower extremity with inflammation: Secondary | ICD-10-CM | POA: Diagnosis not present

## 2021-01-18 DIAGNOSIS — I8312 Varicose veins of left lower extremity with inflammation: Secondary | ICD-10-CM | POA: Diagnosis not present

## 2021-02-05 DIAGNOSIS — H25811 Combined forms of age-related cataract, right eye: Secondary | ICD-10-CM | POA: Diagnosis not present

## 2021-02-05 DIAGNOSIS — Z9842 Cataract extraction status, left eye: Secondary | ICD-10-CM | POA: Diagnosis not present

## 2021-02-05 DIAGNOSIS — Z79899 Other long term (current) drug therapy: Secondary | ICD-10-CM | POA: Diagnosis not present

## 2021-02-05 DIAGNOSIS — I10 Essential (primary) hypertension: Secondary | ICD-10-CM | POA: Diagnosis not present

## 2021-02-05 DIAGNOSIS — Z961 Presence of intraocular lens: Secondary | ICD-10-CM | POA: Diagnosis not present

## 2021-02-05 DIAGNOSIS — Z880 Allergy status to penicillin: Secondary | ICD-10-CM | POA: Diagnosis not present

## 2021-02-05 DIAGNOSIS — H52201 Unspecified astigmatism, right eye: Secondary | ICD-10-CM | POA: Diagnosis not present

## 2021-02-06 DIAGNOSIS — Z961 Presence of intraocular lens: Secondary | ICD-10-CM | POA: Diagnosis not present

## 2021-02-27 ENCOUNTER — Telehealth: Payer: Self-pay

## 2021-02-27 ENCOUNTER — Emergency Department
Admission: EM | Admit: 2021-02-27 | Discharge: 2021-02-27 | Disposition: A | Discharge status: Home / Self Care | Payer: Medicare Other | Source: Home / Self Care | Attending: Family Medicine | Admitting: Family Medicine

## 2021-02-27 ENCOUNTER — Other Ambulatory Visit: Payer: Self-pay

## 2021-02-27 DIAGNOSIS — Z862 Personal history of diseases of the blood and blood-forming organs and certain disorders involving the immune mechanism: Secondary | ICD-10-CM

## 2021-02-27 DIAGNOSIS — R059 Cough, unspecified: Secondary | ICD-10-CM

## 2021-02-27 DIAGNOSIS — J3089 Other allergic rhinitis: Secondary | ICD-10-CM

## 2021-02-27 MED ORDER — BENZONATATE 200 MG PO CAPS: 200.0000 mg | ORAL_CAPSULE | Freq: Two times a day (BID) | ORAL | 0 refills | Status: AC | PRN

## 2021-02-27 MED ORDER — PREDNISONE 20 MG PO TABS: 20.0000 mg | ORAL_TABLET | Freq: Two times a day (BID) | ORAL | 0 refills | Status: AC

## 2021-02-27 NOTE — Discharge Instructions (Addendum)
Drink lots of fluids Take prednisone 2 times a day for 5 days.  Take 2 doses today Use Tessalon for cough.  1 pill 2 or 3 times a day. Expect improvement by the end of the week. Call here or your primary care if you fail to improve

## 2021-02-27 NOTE — ED Triage Notes (Signed)
Pt presents to Urgent Care with c/o cough and runny nose x 2 days. Has not done a COVID test; pt is vaccinated.

## 2021-02-27 NOTE — ED Provider Notes (Signed)
[facility]    CSN: [id number] Arrival date & time: 02/27/21  1349      History   Chief Complaint Chief Complaint  Patient presents with   Cough   Nasal Congestion    HPI Nicole Jennings is a 75 y.o. female.   HPI Patient is here for a cough and nasal congestion.  Symptoms have been present off and on for several days but worse over the last 2 days.  No fever or chills.  No headache or body ache.  No exposure to COVID, influenza or flu.  She is vaccinated. Patient has a past history of sarcoidosis.  He has had 2 chest x-rays this year 1 for pneumonia negative for bronchitis.  She states that she feels like she has a lot of bronchitis but infrequent pneumonia.  On the x-ray report it indicates interstitial lung disease correlating with her history of sarcoid, reticular opacities bilaterally.  Patient states that her sarcoid "does not bother me" because she does not have shortness of breath.  She is not under the care of pulmonology.  She is unaware that the sarcoid may be significant in her recurring respiratory infections. Past Medical History:  Diagnosis Date   Pneumonia     Patient Active Problem List   Diagnosis Date Noted   Sarcoidosis 12/06/2020   Community acquired pneumonia of left upper lobe of lung 08/31/2020   Injury of right foot 05/17/2019   Tendinitis, de Quervain's 05/17/2019   Injury of left thumb 05/07/2018   History of colon polyps 09/08/2017   Osteoarthritis of left knee 03/31/2017   Osteopenia 09/22/2016   Essential hypertension 02/20/2014    Past Surgical History:  Procedure Laterality Date   CATARACT EXTRACTION Right    VEIN SURGERY      OB History   No obstetric history on file.      Home Medications    Prior to Admission medications   Medication Sig Start Date End Date Taking? Authorizing Provider  benzonatate (TESSALON) 200 MG capsule Take 1 capsule (200 mg total) by mouth 2 (two) times daily as needed for cough. 02/27/21   Yes Raylene Everts, MD  HYDROcodone bit-homatropine (HYDROMET) 5-1.5 MG/5ML syrup Take 5 mLs by mouth every 6 (six) hours as needed for cough.   Yes [provider]  predniSONE (DELTASONE) 20 MG tablet Take 1 tablet (20 mg total) by mouth 2 (two) times daily with a meal. 02/27/21  Yes Raylene Everts, MD  amLODipine (NORVASC) 10 MG tablet Take 1 tablet by mouth daily. 04/07/17   [provider]  hydrochlorothiazide (HYDRODIURIL) 25 MG tablet Take by mouth. 05/18/20   [provider]  valsartan (DIOVAN) 320 MG tablet Take by mouth. 11/06/20   [provider]    Family History Family History  Problem Relation Age of Onset   Hyperlipidemia Mother    Healthy Father    Healthy Sister    Healthy Sister     Social History Social History   Tobacco Use   Smoking status: Never   Smokeless tobacco: Never  Vaping Use   Vaping Use: Never used  Substance Use Topics   Alcohol use: Not Currently   Drug use: Never     Allergies   Penicillins and Latex   Review of Systems Review of Systems See HPI  Physical Exam Triage Vital Signs ED Triage Vitals  Enc Vitals Group     BP 02/27/21 1419 120/76     Pulse Rate 02/27/21  1419 76     Resp 02/27/21 1419 20     Temp 02/27/21 1419 98.6 F (37 C)     Temp Source 02/27/21 1419 Oral     SpO2 02/27/21 1419 96 %     Weight 02/27/21 1413 170 lb (77.1 kg)     Height 02/27/21 1413 5\' 2"  (1.575 m)     Head Circumference --      Peak Flow --      Pain Score 02/27/21 1413 0     Pain Loc --      Pain Edu? --      Excl. in [place]? --    No data found.  Updated Vital Signs BP 120/76 (BP Location: Right Arm)   Pulse 76   Temp 98.6 F (37 C) (Oral)   Resp 20   Ht 5\' 2"  (1.575 m)   Wt 77.1 kg   SpO2 96%   BMI 31.09 kg/m       Physical Exam Constitutional:      General: She is not in acute distress.    Appearance: She is well-developed and normal weight.  HENT:     Head: Normocephalic and  atraumatic.     Right Ear: Tympanic membrane and ear canal normal.     Left Ear: Tympanic membrane and ear canal normal.     Nose: Congestion and rhinorrhea present.     Comments: Nasal membrane swollen and red.  Congested.  Clear rhinorrhea    Mouth/Throat:     Mouth: Mucous membranes are moist.     Pharynx: Posterior oropharyngeal erythema present.     Comments: Mild erythema posterior pharynx from postnasal drip Eyes:     Conjunctiva/sclera: Conjunctivae normal.     Pupils: Pupils are equal, round, and reactive to light.  Cardiovascular:     Rate and Rhythm: Normal rate and regular rhythm.     Heart sounds: Normal heart sounds.  Pulmonary:     Effort: Pulmonary effort is normal. No respiratory distress.     Breath sounds: Normal breath sounds. No wheezing, rhonchi or rales.  Abdominal:     General: There is no distension.     Palpations: Abdomen is soft.  Musculoskeletal:        General: Normal range of motion.     Cervical back: Normal range of motion.  Skin:    General: Skin is warm and dry.  Neurological:     Mental Status: She is alert.  Psychiatric:        Mood and Affect: Mood normal.        Behavior: Behavior normal.     UC Treatments / Results  Labs (all labs ordered are listed, but only abnormal results are displayed) Labs Reviewed - No data to display  EKG   Radiology No results found.  Procedures Procedures (including critical care time)  Medications Ordered in UC Medications - No data to display  Initial Impression / Assessment and Plan / UC Course  I have reviewed the triage vital signs and the nursing notes.  Pertinent labs & imaging results that were available during my care of the patient were reviewed by me and considered in my medical decision making (see chart for details).     Patient states she saw good improvement from prednisone last time she took it.  She also needs help with cough management.  She is taking all of the Hycodan from  her last visit, and I discouraged her from taking cough medicines  with codeine/hydrocodone.  We will try Tessalon.  Follow with PCP Final Clinical Impressions(s) / UC Diagnoses   Final diagnoses:  Cough  Environmental and seasonal allergies  History of sarcoidosis     Discharge Instructions      Drink lots of fluids Take prednisone 2 times a day for 5 days.  Take 2 doses today Use Tessalon for cough.  1 pill 2 or 3 times a day. Expect improvement by the end of the week. Call here or your primary care if you fail to improve   ED Prescriptions     Medication Sig Dispense Auth. Provider   predniSONE (DELTASONE) 20 MG tablet Take 1 tablet (20 mg total) by mouth 2 (two) times daily with a meal. 10 tablet Raylene Everts, MD   benzonatate (TESSALON) 200 MG capsule Take 1 capsule (200 mg total) by mouth 2 (two) times daily as needed for cough. 20 capsule Raylene Everts, MD      PDMP not reviewed this encounter.   Raylene Everts, MD 02/27/21 [phone]

## 2021-02-27 NOTE — Telephone Encounter (Unsigned)
Pt calls to report that her pharmacy does not have a Rx for Prednisone ready; however, did have the Tessalon Rx. System states Prednisone e-script was received by [facility]. TC to [facility] to leave verbal order (via VM) for Prednisone Rx exactly as written by Dr. Meda Coffee today. Pt aware.

## 2022-07-20 IMAGING — DX DG CHEST 2V
2 series · 2 of 2 positions shown · non-contrast
Comparison: None.

CLINICAL DATA: Cough, left chest and rib pain, night sweats

EXAM:
CHEST - 2 VIEW

[chest pa]
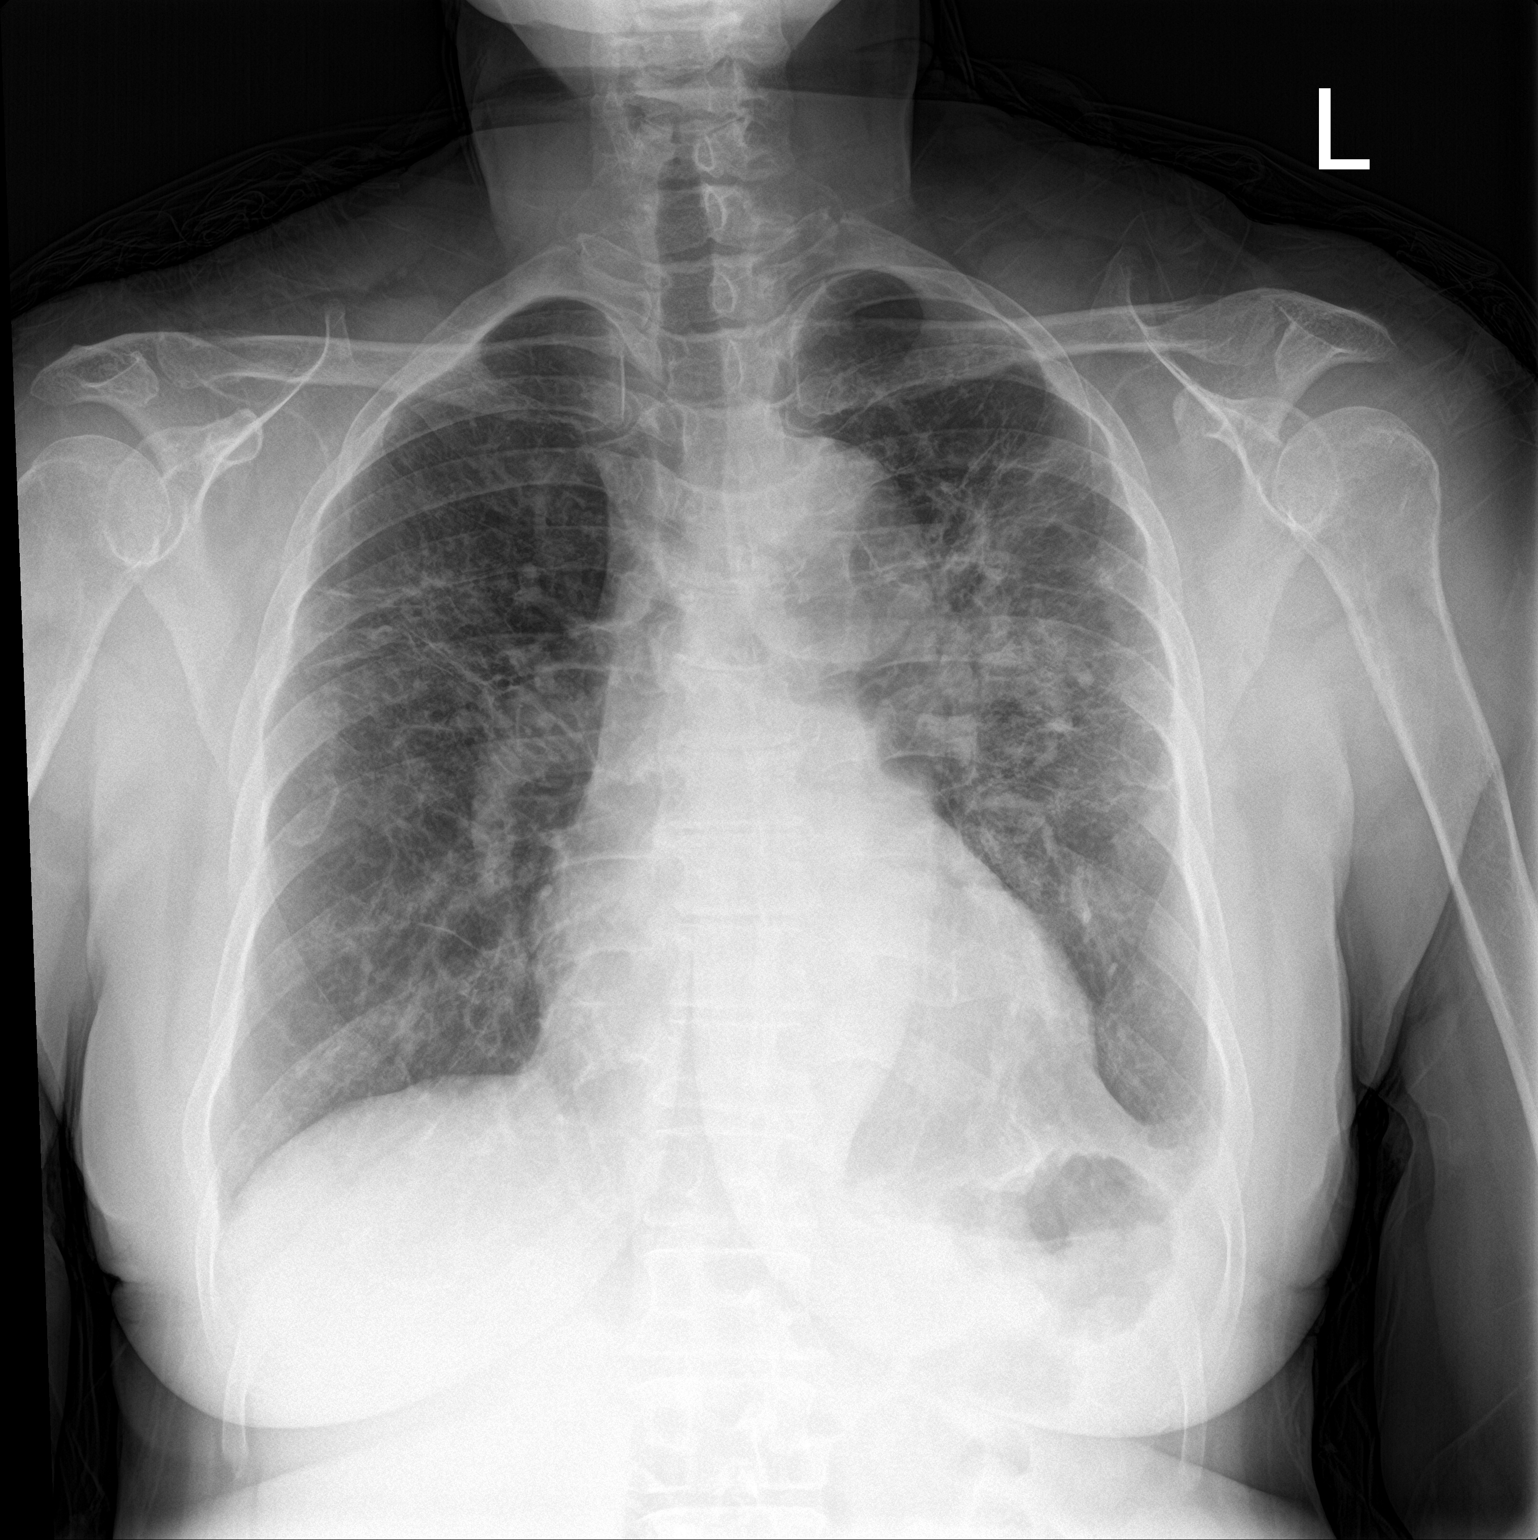

[chest lat]
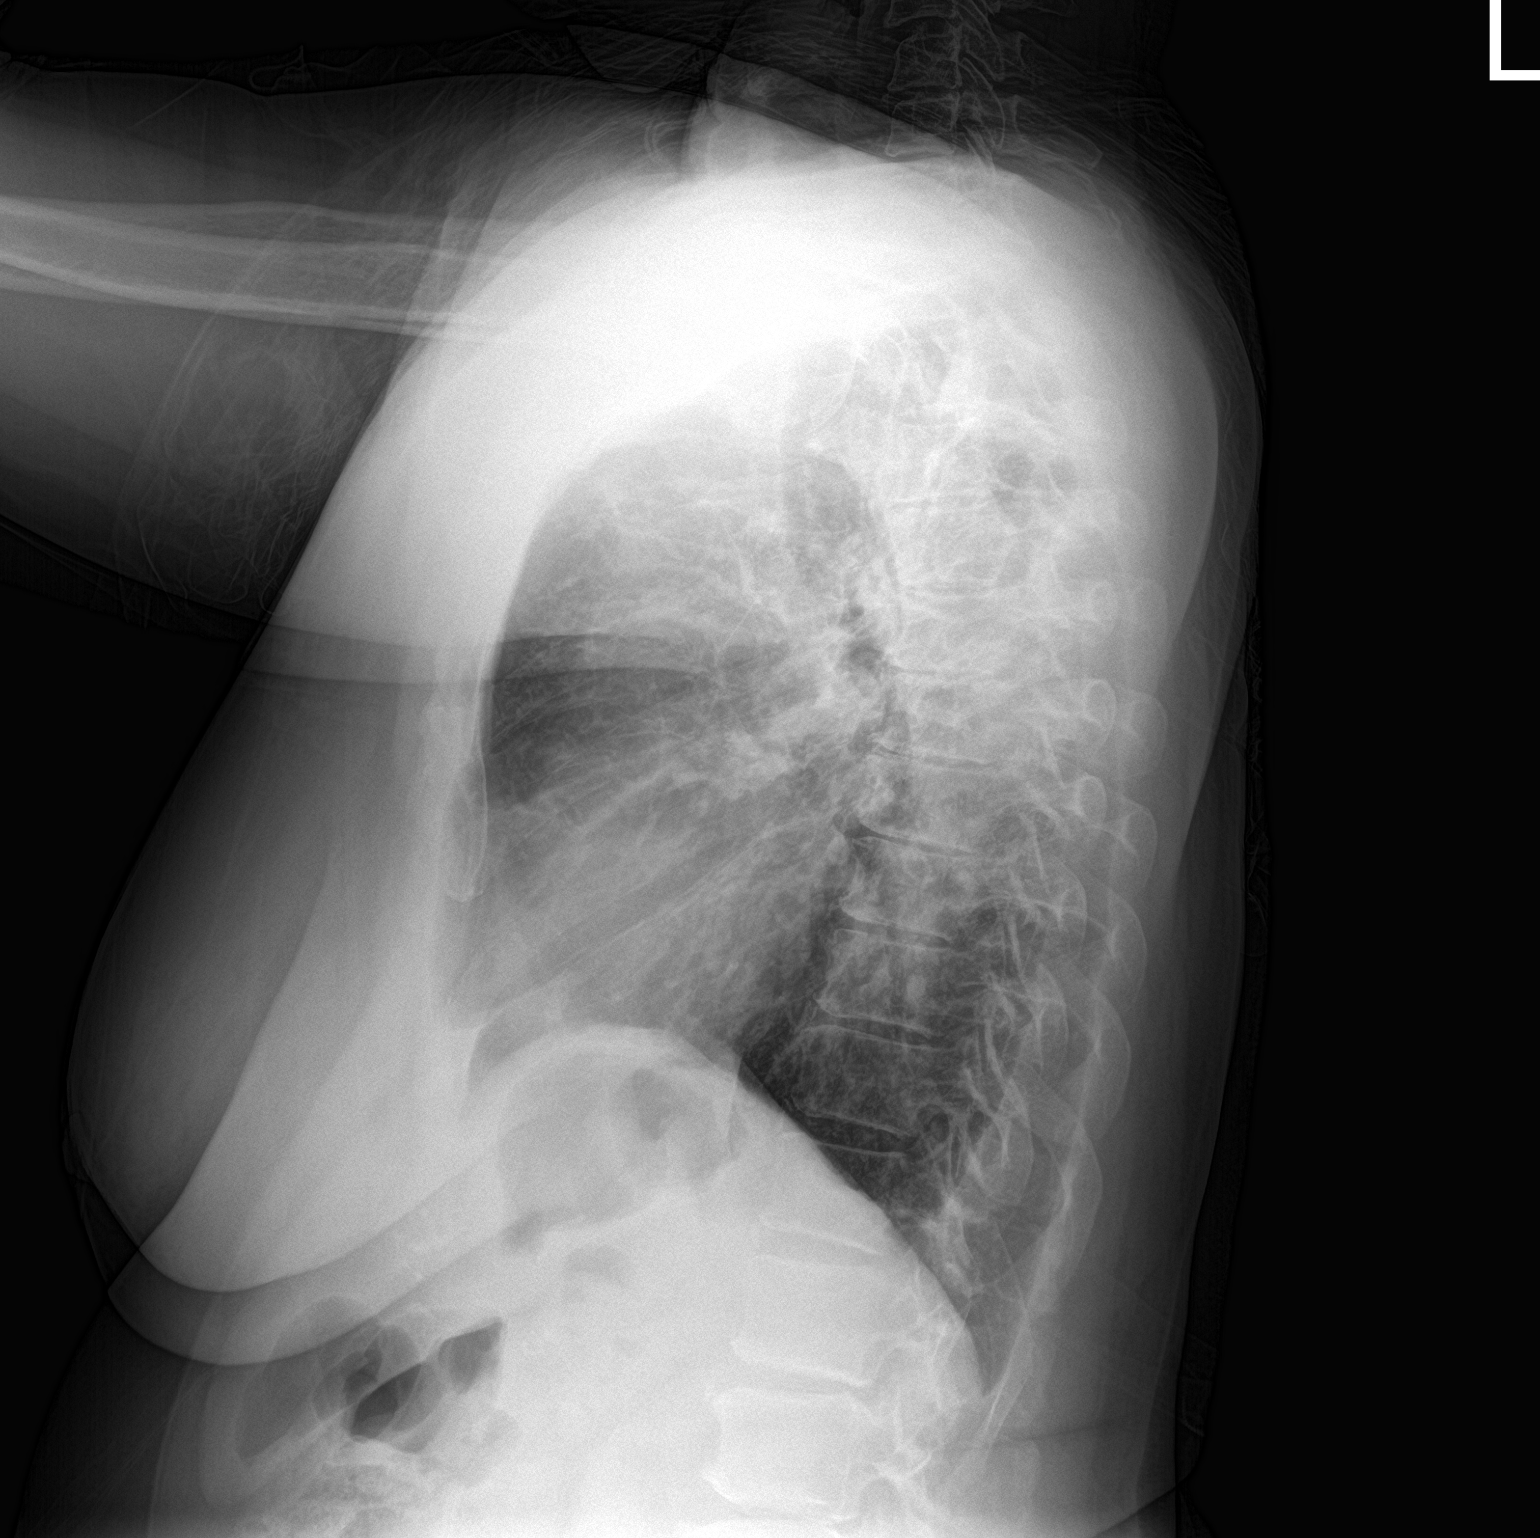

[2 of 2 positions shown; findings below may reference images not displayed]

FINDINGS: Background perihilar and upper lobe parenchymal bandlike scarring
and retraction compatible with chronic lung disease. Asymmetric left
upper lobe hilar patchy and nodular opacities may represent
superimposed pneumonia. Blunting of left costophrenic angle suggests
small left effusion.

Additional areas of bibasilar scarring or atelectasis. No
pneumothorax. Trachea midline. Normal heart size. No acute osseous
finding.
IMPRESSION: Left upper lobe/hilar patchy and nodular airspace process concerning
for pneumonia and possibly associated small left effusion. Recommend
radiographic follow-up after treatment to document resolution and to
exclude underlying mass.

Evidence of chronic background perihilar parenchymal lung disease
with upper lobe retraction suggesting chronic collagen vascular
disease process such as sarcoidosis.

## 2022-10-31 IMAGING — DX DG CHEST 2V
2 series · 2 of 2 positions shown · non-contrast
Comparison: 08/25/2020

CLINICAL DATA: Cough and shortness of breath

EXAM:
CHEST - 2 VIEW

[chest pa]
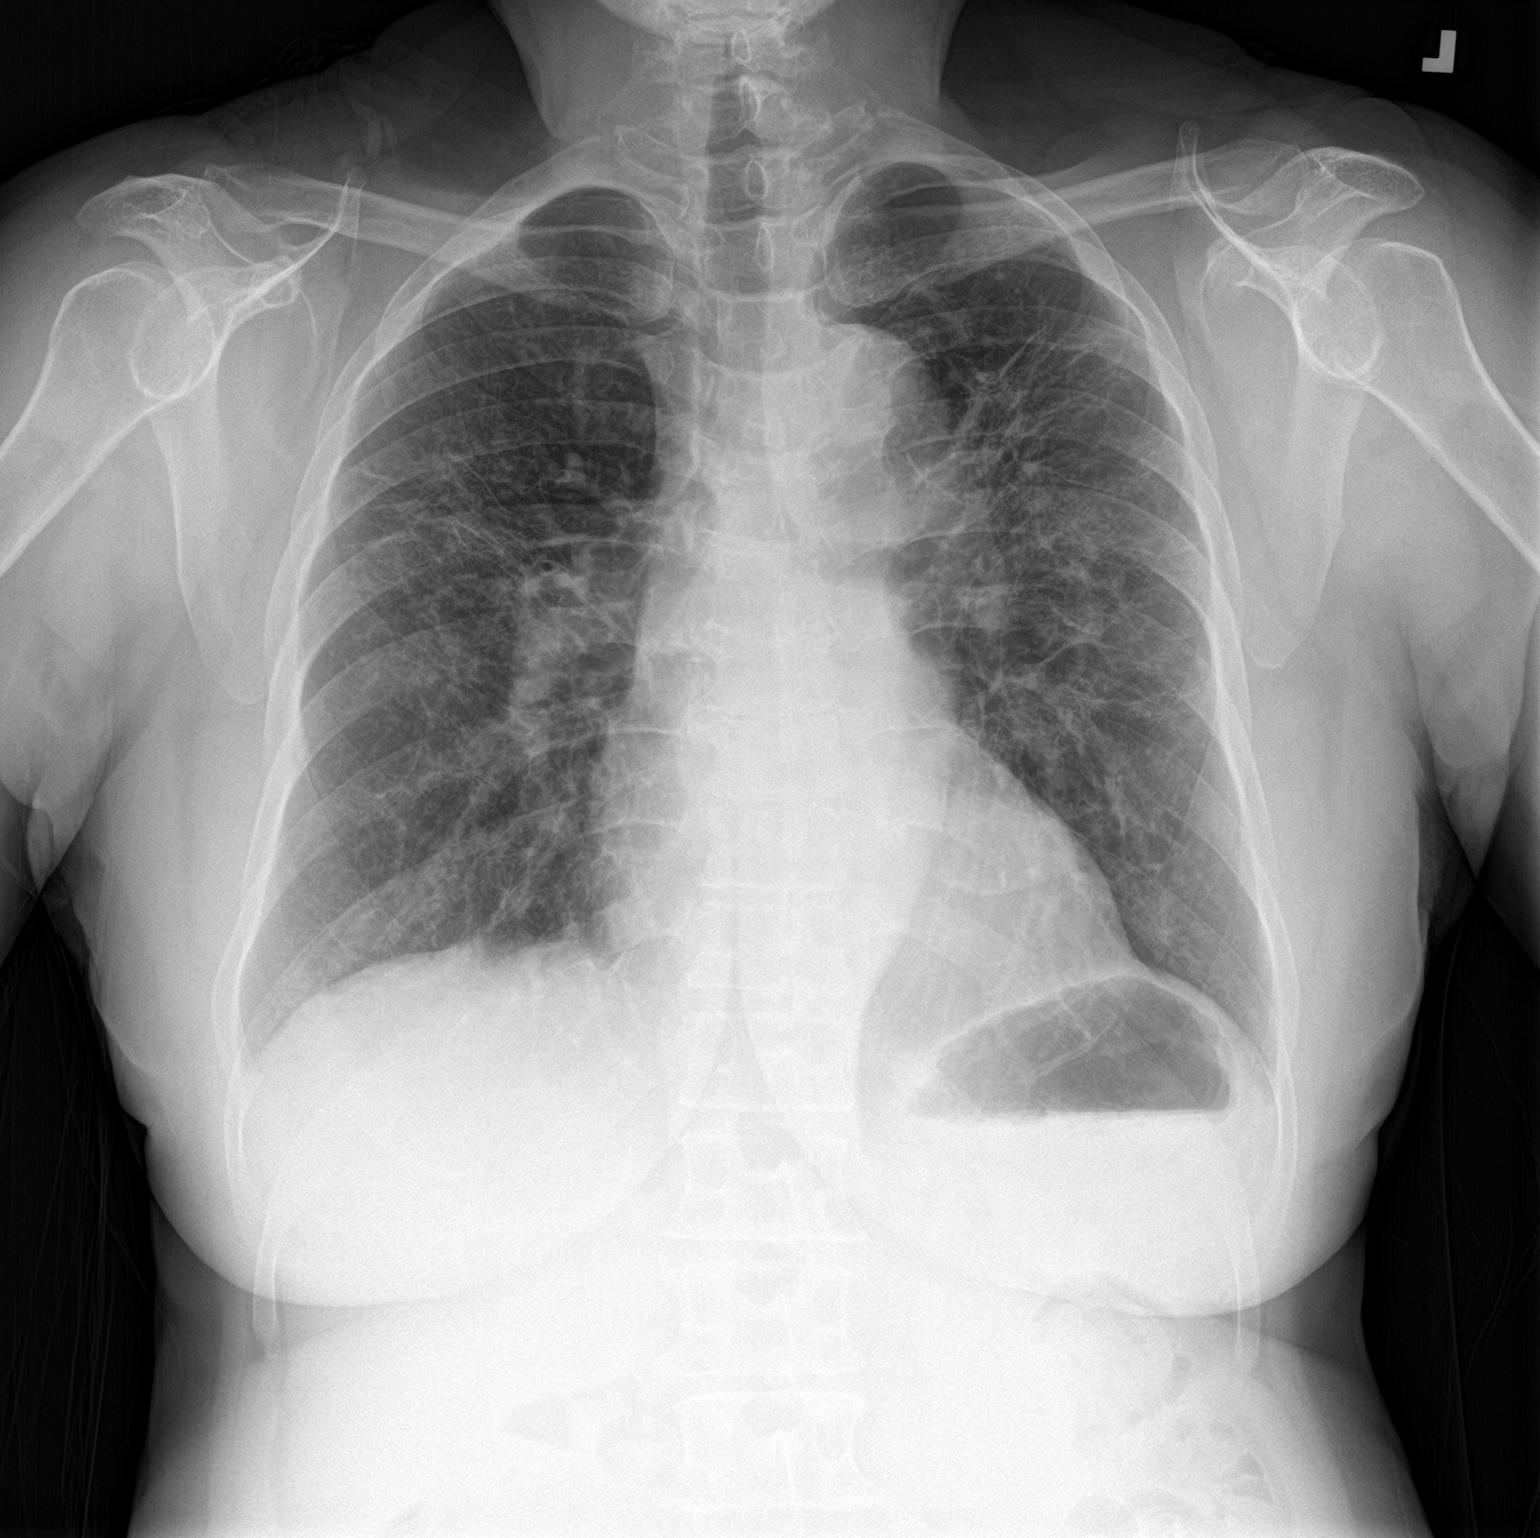

[chest lat]
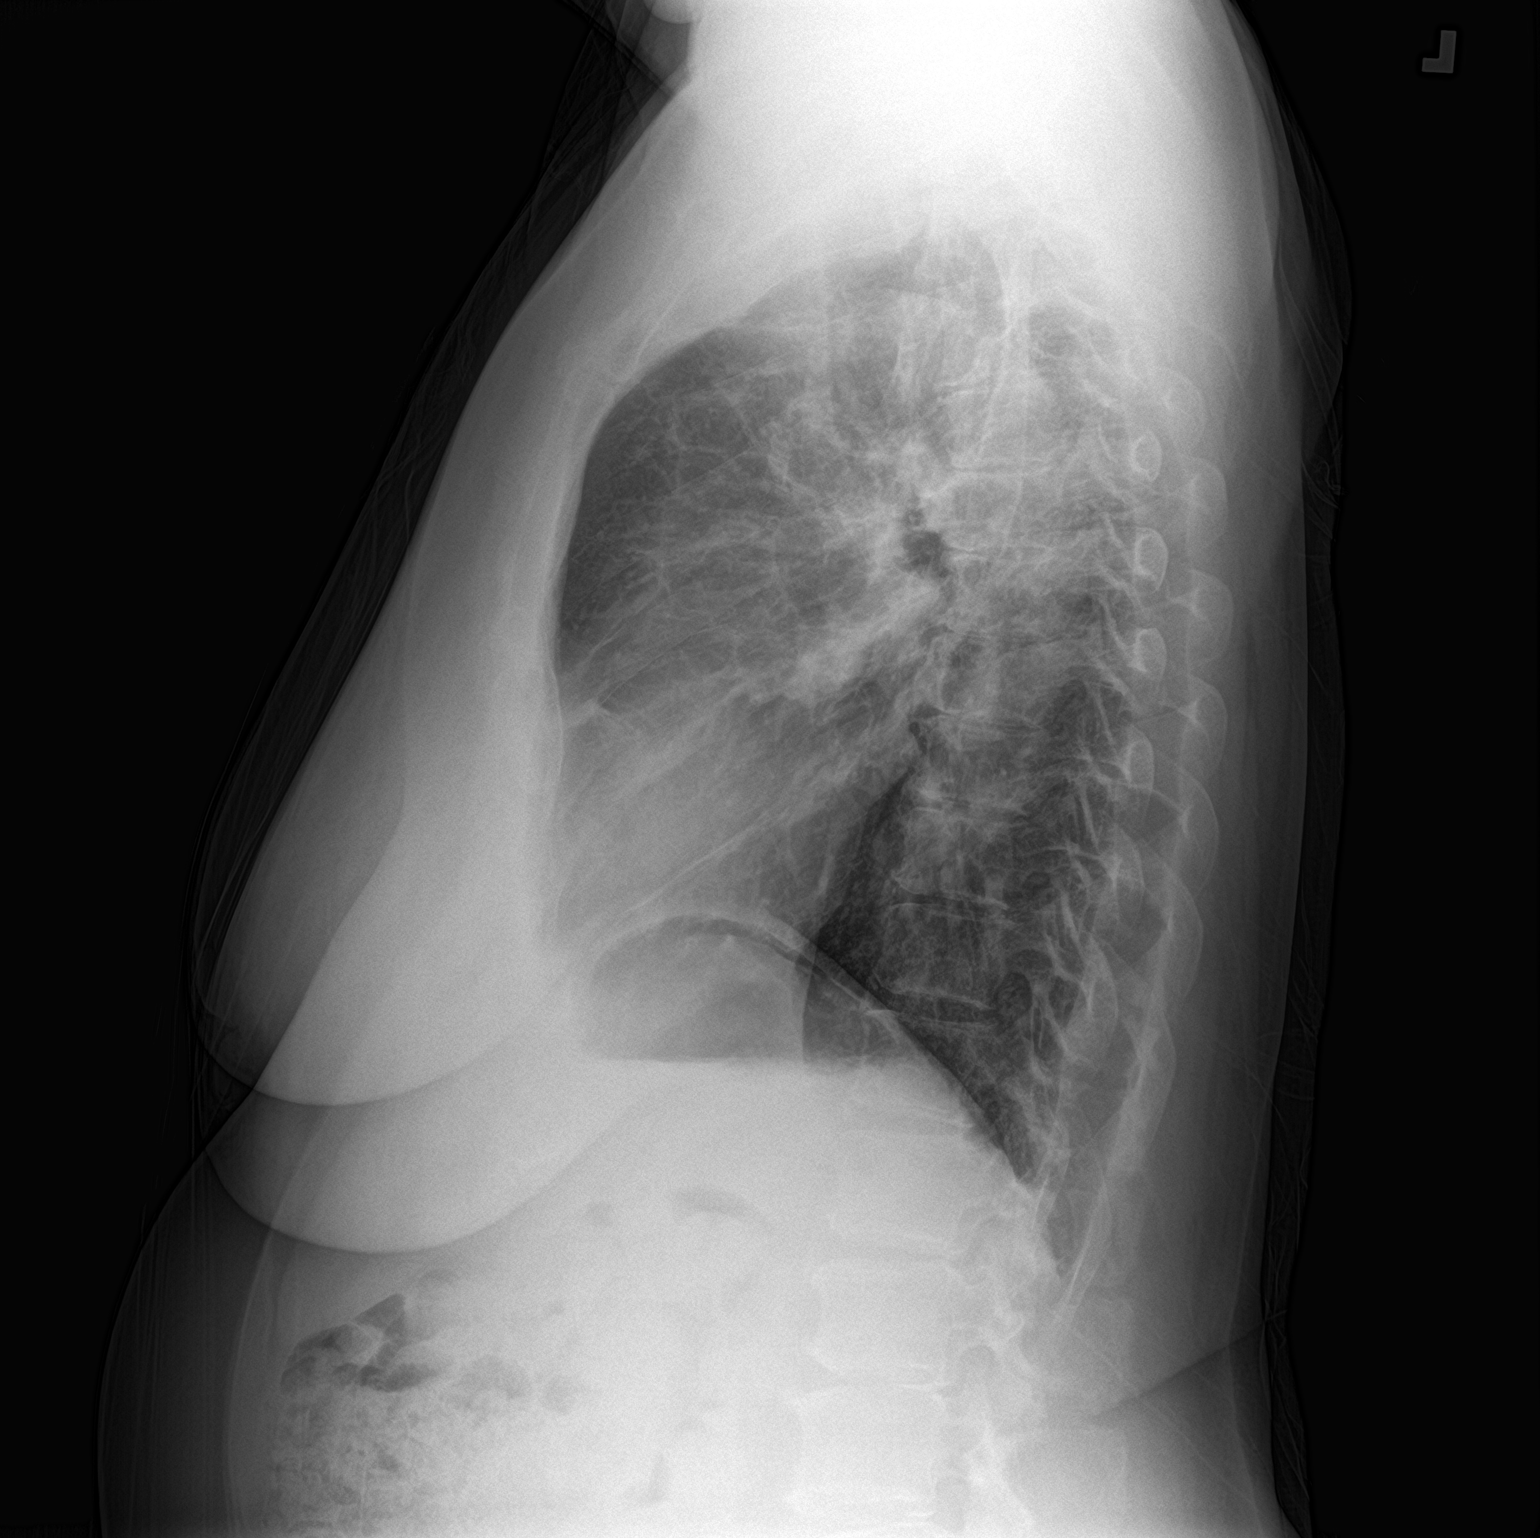

[2 of 2 positions shown; findings below may reference images not displayed]

FINDINGS: Normal heart size and stable mediastinal contours. Reticular opacity
bilaterally. Left perihilar aeration is improved from prior. No
edema, effusion, or pneumothorax.
IMPRESSION: No acute finding when compared to prior.

Interstitial lung disease correlating with history of sarcoid.
